# Patient Record
Sex: Male | Born: 1995 | Race: White | Hispanic: Yes | Marital: Single | Smoking: Never smoker
Health system: Southern US, Community
[De-identification: ages and names within clinical notes are randomized; demographics above are authoritative.]

---

## 2017-04-21 ENCOUNTER — Encounter (HOSPITAL_COMMUNITY): Payer: Self-pay

## 2017-04-21 ENCOUNTER — Emergency Department (HOSPITAL_COMMUNITY): Payer: Self-pay

## 2017-04-21 ENCOUNTER — Emergency Department (HOSPITAL_COMMUNITY)
Admission: EM | Admit: 2017-04-21 | Discharge: 2017-04-21 | Disposition: A | Discharge status: Home / Self Care | Payer: Self-pay | Attending: Emergency Medicine | Admitting: Emergency Medicine

## 2017-04-21 DIAGNOSIS — Y999 Unspecified external cause status: Secondary | ICD-10-CM | POA: Insufficient documentation

## 2017-04-21 DIAGNOSIS — Z23 Encounter for immunization: Secondary | ICD-10-CM | POA: Insufficient documentation

## 2017-04-21 DIAGNOSIS — Y9389 Activity, other specified: Secondary | ICD-10-CM | POA: Insufficient documentation

## 2017-04-21 DIAGNOSIS — Y9289 Other specified places as the place of occurrence of the external cause: Secondary | ICD-10-CM | POA: Insufficient documentation

## 2017-04-21 DIAGNOSIS — S0101XA Laceration without foreign body of scalp, initial encounter: Secondary | ICD-10-CM | POA: Insufficient documentation

## 2017-04-21 DIAGNOSIS — R55 Syncope and collapse: Secondary | ICD-10-CM | POA: Insufficient documentation

## 2017-04-21 MED ORDER — OXYCODONE-ACETAMINOPHEN 5-325 MG PO TABS
1.0000 | ORAL_TABLET | Freq: Once | ORAL | Status: AC
  Administered 2017-04-21: 1 via ORAL
  Filled 2017-04-21: qty 1

## 2017-04-21 MED ORDER — TETANUS-DIPHTH-ACELL PERTUSSIS 5-2.5-18.5 LF-MCG/0.5 IM SUSP
0.5000 mL | Freq: Once | INTRAMUSCULAR | Status: AC
  Administered 2017-04-21: 0.5 mL via INTRAMUSCULAR
  Filled 2017-04-21: qty 0.5

## 2017-04-21 MED ORDER — LIDOCAINE-EPINEPHRINE-TETRACAINE (LET) SOLUTION
3.0000 mL | Freq: Once | NASAL | Status: AC
  Administered 2017-04-21: 04:00:00 3 mL via TOPICAL
  Filled 2017-04-21: qty 3

## 2017-04-21 NOTE — Discharge Instructions (Signed)
You need to have staples removed from your scalp in 1 week. Try to keep them clean and dry best you can. Return here for any new/worsening symptoms.
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
## 2017-04-21 NOTE — ED Notes (Signed)
Patient transported to CT 
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
## 2017-04-21 NOTE — ED Notes (Signed)
Patient denies pain and is resting comfortably.  
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
